# Patient Record
Sex: Male | Born: 1975 | Race: Black or African American | Hispanic: No | Marital: Single
Health system: Southern US, Community
[De-identification: ages and names within clinical notes are randomized; demographics above are authoritative.]

---

## 2004-10-16 ENCOUNTER — Other Ambulatory Visit: Payer: Self-pay

## 2004-10-16 ENCOUNTER — Emergency Department: Payer: Self-pay | Admitting: Internal Medicine

## 2005-01-03 ENCOUNTER — Emergency Department: Payer: Self-pay | Admitting: Emergency Medicine

## 2006-06-12 ENCOUNTER — Other Ambulatory Visit: Payer: Self-pay

## 2006-06-12 ENCOUNTER — Emergency Department: Payer: Self-pay | Admitting: Emergency Medicine

## 2006-08-11 ENCOUNTER — Emergency Department: Payer: Self-pay | Admitting: Emergency Medicine

## 2007-03-11 ENCOUNTER — Emergency Department: Payer: Self-pay | Admitting: Emergency Medicine

## 2007-03-12 ENCOUNTER — Emergency Department: Payer: Self-pay | Admitting: Emergency Medicine

## 2008-06-24 ENCOUNTER — Emergency Department: Payer: Self-pay | Admitting: Emergency Medicine

## 2010-01-06 ENCOUNTER — Emergency Department: Payer: Self-pay | Admitting: Emergency Medicine

## 2010-06-07 ENCOUNTER — Emergency Department: Payer: Self-pay | Admitting: Emergency Medicine

## 2012-05-13 ENCOUNTER — Emergency Department: Payer: Self-pay | Admitting: Emergency Medicine

## 2012-05-13 LAB — CBC
HGB: 14.2 g/dL (ref 13.0–18.0)
Platelet: 204 10*3/uL (ref 150–440)
RBC: 5.15 10*6/uL (ref 4.40–5.90)
RDW: 15.1 % — ABNORMAL HIGH (ref 11.5–14.5)

## 2012-05-13 LAB — BASIC METABOLIC PANEL
Anion Gap: 5 — ABNORMAL LOW (ref 7–16)
BUN: 14 mg/dL (ref 7–18)
Chloride: 110 mmol/L — ABNORMAL HIGH (ref 98–107)
Creatinine: 1 mg/dL (ref 0.60–1.30)
Glucose: 100 mg/dL — ABNORMAL HIGH (ref 65–99)
Osmolality: 284 (ref 275–301)
Sodium: 142 mmol/L (ref 136–145)

## 2012-05-13 LAB — CK TOTAL AND CKMB (NOT AT ARMC): CK-MB: 1 ng/mL (ref 0.5–3.6)

## 2012-05-16 ENCOUNTER — Emergency Department: Payer: Self-pay | Admitting: Emergency Medicine

## 2016-10-31 ENCOUNTER — Emergency Department: Payer: Self-pay

## 2016-10-31 ENCOUNTER — Encounter: Payer: Self-pay | Admitting: Emergency Medicine

## 2016-10-31 ENCOUNTER — Emergency Department
Admission: EM | Admit: 2016-10-31 | Discharge: 2016-10-31 | Disposition: A | Discharge status: Home / Self Care | Payer: Self-pay | Attending: Emergency Medicine | Admitting: Emergency Medicine

## 2016-10-31 DIAGNOSIS — S060X1A Concussion with loss of consciousness of 30 minutes or less, initial encounter: Secondary | ICD-10-CM | POA: Insufficient documentation

## 2016-10-31 DIAGNOSIS — W51XXXA Accidental striking against or bumped into by another person, initial encounter: Secondary | ICD-10-CM | POA: Insufficient documentation

## 2016-10-31 DIAGNOSIS — Y999 Unspecified external cause status: Secondary | ICD-10-CM | POA: Insufficient documentation

## 2016-10-31 DIAGNOSIS — Y9364 Activity, baseball: Secondary | ICD-10-CM | POA: Insufficient documentation

## 2016-10-31 DIAGNOSIS — S20212A Contusion of left front wall of thorax, initial encounter: Secondary | ICD-10-CM | POA: Insufficient documentation

## 2016-10-31 DIAGNOSIS — Y929 Unspecified place or not applicable: Secondary | ICD-10-CM | POA: Insufficient documentation

## 2016-10-31 LAB — CBC WITH DIFFERENTIAL/PLATELET
Basophils Absolute: 0 10*3/uL (ref 0–0.1)
Basophils Relative: 0 %
EOS ABS: 0.2 10*3/uL (ref 0–0.7)
EOS PCT: 3 %
HEMATOCRIT: 43.4 % (ref 40.0–52.0)
Hemoglobin: 14.9 g/dL (ref 13.0–18.0)
LYMPHS ABS: 2 10*3/uL (ref 1.0–3.6)
Lymphocytes Relative: 28 %
MCH: 28.2 pg (ref 26.0–34.0)
MCHC: 34.3 g/dL (ref 32.0–36.0)
MCV: 82.3 fL (ref 80.0–100.0)
MONOS PCT: 9 %
Monocytes Absolute: 0.6 10*3/uL (ref 0.2–1.0)
Neutro Abs: 4.5 10*3/uL (ref 1.4–6.5)
Neutrophils Relative %: 60 %
Platelets: 186 10*3/uL (ref 150–440)
RBC: 5.28 MIL/uL (ref 4.40–5.90)
RDW: 16 % — AB (ref 11.5–14.5)
WBC: 7.4 10*3/uL (ref 3.8–10.6)

## 2016-10-31 LAB — BASIC METABOLIC PANEL
Anion gap: 10 (ref 5–15)
BUN: 18 mg/dL (ref 6–20)
CHLORIDE: 106 mmol/L (ref 101–111)
CO2: 26 mmol/L (ref 22–32)
CREATININE: 1.38 mg/dL — AB (ref 0.61–1.24)
Calcium: 9.1 mg/dL (ref 8.9–10.3)
Glucose, Bld: 83 mg/dL (ref 65–99)
Potassium: 3.4 mmol/L — ABNORMAL LOW (ref 3.5–5.1)
SODIUM: 142 mmol/L (ref 135–145)

## 2016-10-31 LAB — TROPONIN I

## 2016-10-31 MED ORDER — OXYCODONE-ACETAMINOPHEN 5-325 MG PO TABS
ORAL_TABLET | ORAL | Status: AC
  Administered 2016-10-31: 1
  Filled 2016-10-31: qty 1
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
## 2016-10-31 NOTE — Discharge Instructions (Signed)
Return to the emergency room for any new or worrisome symptoms, follow closely with primary care doctor, if he has significant headache, significant chest pain shortness of breath or you feel worse in any way please return to emergency care. Avoid head injury or any activities that could cause a head injury until all of your headache or any other concussion symptoms he may have have subsided
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
## 2016-10-31 NOTE — ED Notes (Signed)
Patient transported to CT 

## 2019-08-11 ENCOUNTER — Other Ambulatory Visit: Payer: Self-pay

## 2019-08-11 ENCOUNTER — Emergency Department: Payer: Self-pay

## 2019-08-11 DIAGNOSIS — Y929 Unspecified place or not applicable: Secondary | ICD-10-CM | POA: Insufficient documentation

## 2019-08-11 DIAGNOSIS — Y939 Activity, unspecified: Secondary | ICD-10-CM | POA: Insufficient documentation

## 2019-08-11 DIAGNOSIS — X58XXXA Exposure to other specified factors, initial encounter: Secondary | ICD-10-CM | POA: Insufficient documentation

## 2019-08-11 DIAGNOSIS — M79641 Pain in right hand: Secondary | ICD-10-CM | POA: Insufficient documentation

## 2019-08-11 DIAGNOSIS — Y999 Unspecified external cause status: Secondary | ICD-10-CM | POA: Insufficient documentation

## 2019-08-11 DIAGNOSIS — S60221A Contusion of right hand, initial encounter: Secondary | ICD-10-CM | POA: Insufficient documentation
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
## 2019-08-12 ENCOUNTER — Emergency Department
Admission: EM | Admit: 2019-08-12 | Discharge: 2019-08-12 | Disposition: A | Discharge status: Home / Self Care | Payer: Self-pay | Attending: Emergency Medicine | Admitting: Emergency Medicine

## 2019-08-12 DIAGNOSIS — S60221A Contusion of right hand, initial encounter: Secondary | ICD-10-CM

## 2019-08-12 DIAGNOSIS — M79641 Pain in right hand: Secondary | ICD-10-CM

## 2019-08-12 MED ORDER — IBUPROFEN 800 MG PO TABS: 800.0000 mg | ORAL_TABLET | Freq: Three times a day (TID) | ORAL | 0 refills | Status: DC | PRN

## 2019-08-12 MED ORDER — KETOROLAC TROMETHAMINE 60 MG/2ML IM SOLN
30.0000 mg | Freq: Once | INTRAMUSCULAR | Status: AC
  Administered 2019-08-12: 02:00:00 30 mg via INTRAMUSCULAR
  Filled 2019-08-12: qty 2

## 2019-08-12 MED ORDER — HYDROCODONE-ACETAMINOPHEN 5-325 MG PO TABS: 1.0000 | ORAL_TABLET | Freq: Four times a day (QID) | ORAL | 0 refills | Status: DC | PRN
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
## 2019-08-12 NOTE — Discharge Instructions (Addendum)
1.  Wear splint as needed for comfort. 2.  You may take pain pills as needed (Motrin/Norco #15). 3.  Return to the ER for worsening symptoms, persistent vomiting, difficulty breathing or other concerns.

## 2021-10-30 IMAGING — CR DG HAND COMPLETE 3+V*R*
3 series · 3 of 3 positions shown · non-contrast
Comparison: None.

CLINICAL DATA: Right hand pain after injury.  Swelling.

EXAM:
RIGHT HAND - COMPLETE 3+ VIEW

[hand ap]
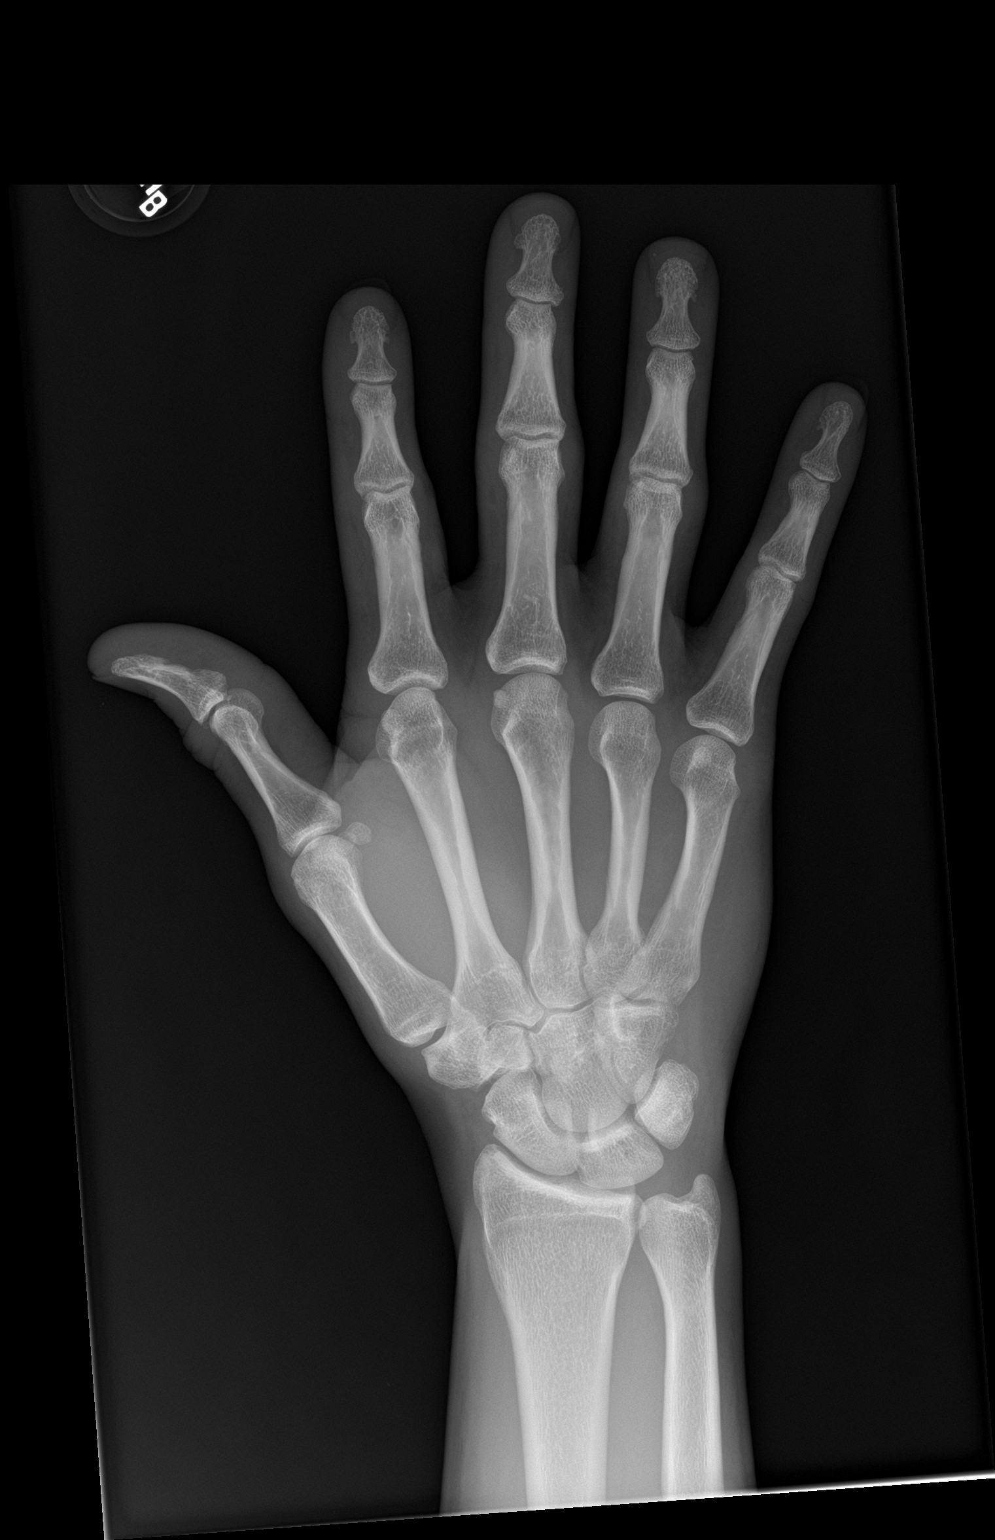

[hand obl]
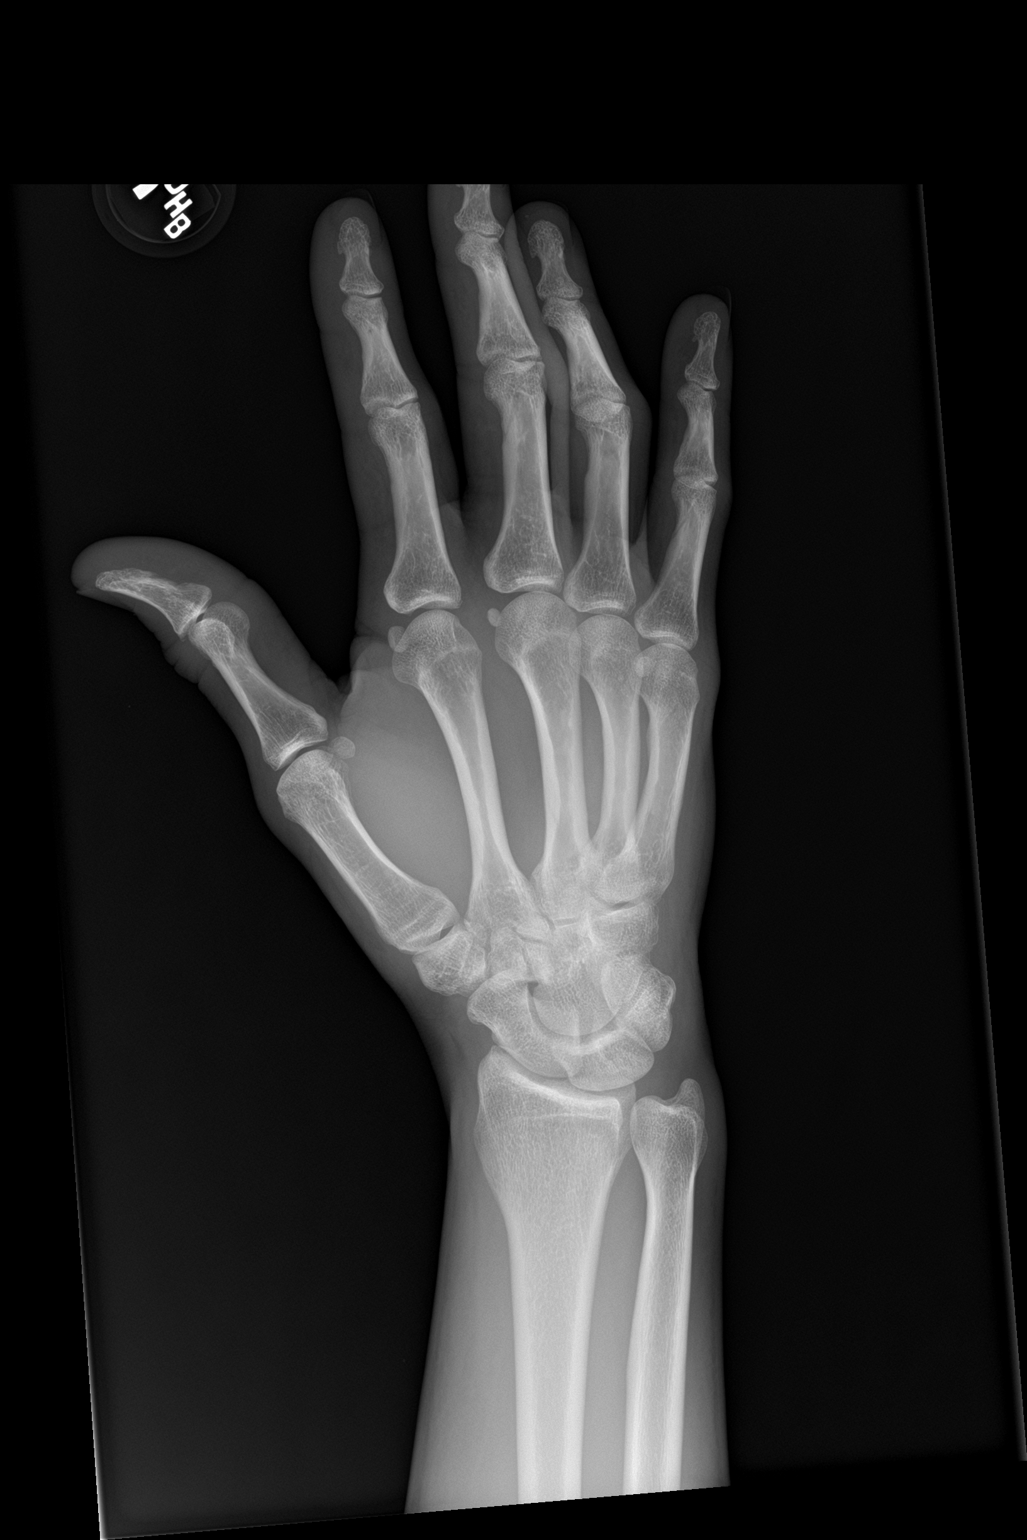

[hand lat]
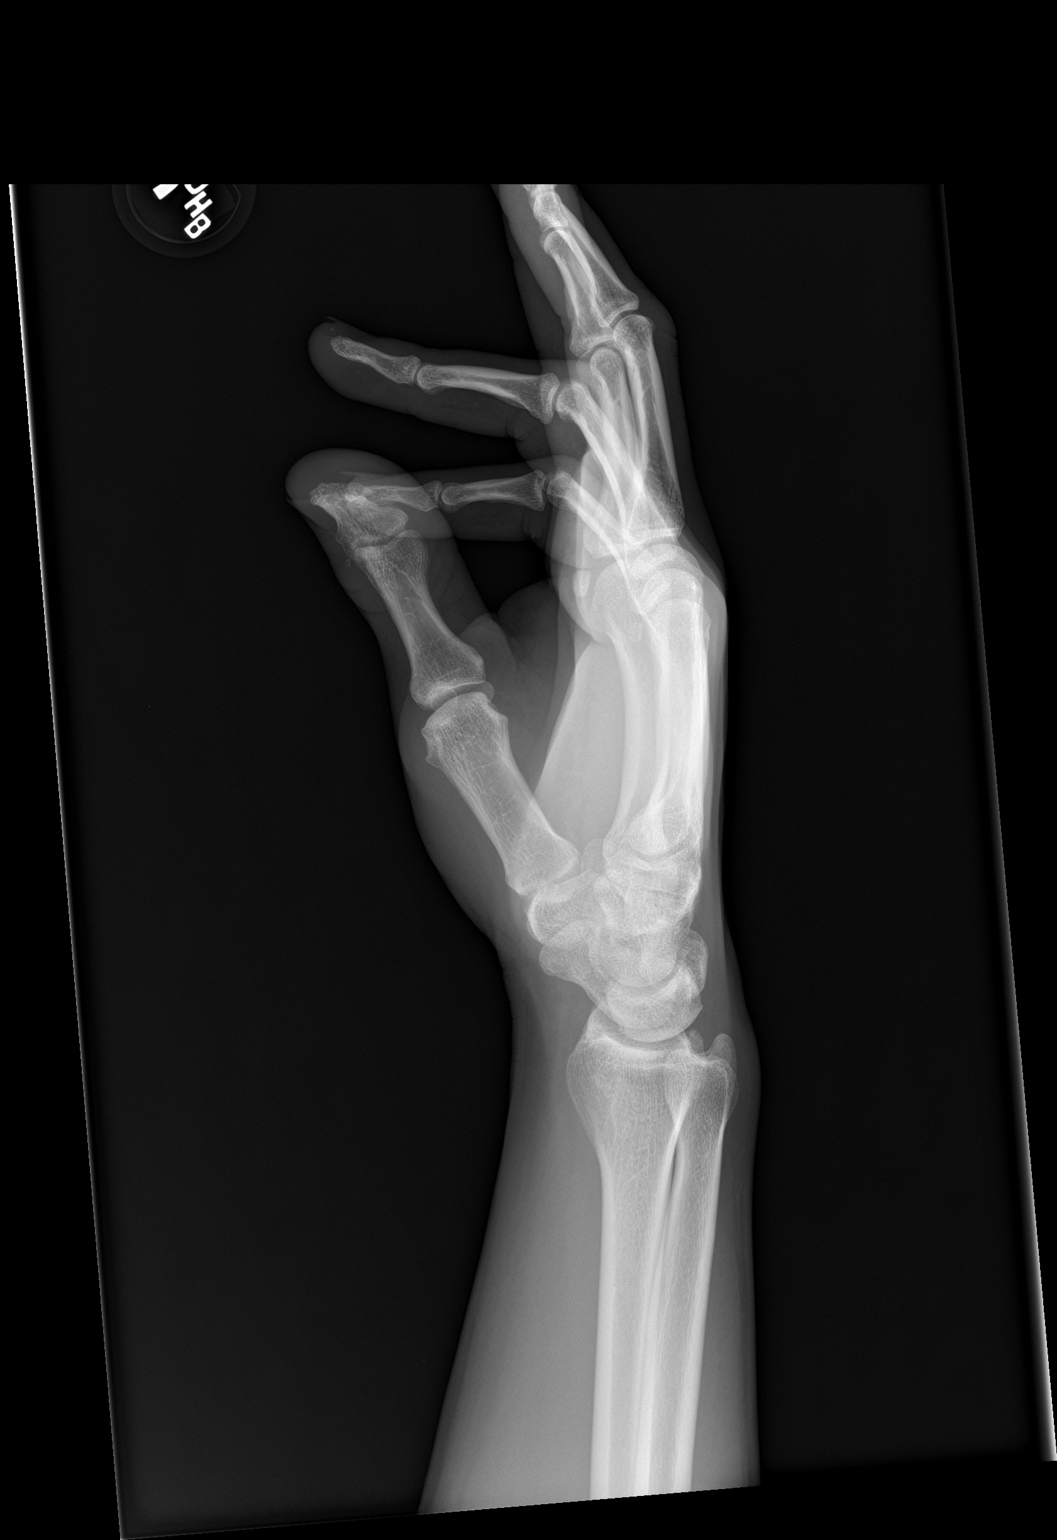

[3 of 3 positions shown; findings below may reference images not displayed]

FINDINGS: There is no evidence of fracture or dislocation. There is no
evidence of arthropathy or other focal bone abnormality. Soft
tissues are unremarkable.
IMPRESSION: Negative.

## 2023-05-09 ENCOUNTER — Encounter: Payer: Self-pay | Admitting: *Deleted

## 2023-05-27 ENCOUNTER — Telehealth: Payer: Self-pay | Admitting: *Deleted

## 2023-05-27 ENCOUNTER — Other Ambulatory Visit: Payer: Self-pay | Admitting: *Deleted

## 2023-05-27 DIAGNOSIS — Z1211 Encounter for screening for malignant neoplasm of colon: Secondary | ICD-10-CM

## 2023-05-27 MED ORDER — NA SULFATE-K SULFATE-MG SULF 17.5-3.13-1.6 GM/177ML PO SOLN: 1.0000 | Freq: Once | ORAL | 0 refills | Status: AC

## 2023-05-27 NOTE — Telephone Encounter (Signed)
 Gastroenterology Pre-Procedure Review  Request Date: 06/20/2023 Requesting Physician: Dr. Ole Berkeley  PATIENT REVIEW QUESTIONS: The patient responded to the following health history questions as indicated:    1. Are you having any GI issues? no 2. Do you have a personal history of Polyps? no 3. Do you have a family history of Colon Cancer or Polyps? no 4. Diabetes Mellitus? no 5. Joint replacements in the past 12 months?no 6. Major health problems in the past 3 months?no 7. Any artificial heart valves, MVP, or defibrillator?no    MEDICATIONS & ALLERGIES:    Patient reports the following regarding taking any anticoagulation/antiplatelet therapy:   Plavix, Coumadin, Eliquis, Xarelto, Lovenox, Pradaxa, Brilinta, or Effient? no Aspirin? no  Patient confirms/reports the following medications:  Current Outpatient Medications  Medication Sig Dispense Refill   Na Sulfate-K Sulfate-Mg Sulfate concentrate (SUPREP) 17.5-3.13-1.6 GM/177ML SOLN Take 1 kit (354 mLs total) by mouth once for 1 dose. 354 mL 0   No current facility-administered medications for this visit.    Patient confirms/reports the following allergies:  No Known Allergies  No orders of the defined types were placed in this encounter.   AUTHORIZATION INFORMATION Primary Insurance: 1D#: Group #:  Secondary Insurance: 1D#: Group #:  SCHEDULE INFORMATION: Date: 06/20/2023 Time: Location:  [facility]

## 2023-06-20 ENCOUNTER — Encounter
Admission: RE | Disposition: A | Discharge status: Home / Self Care | Payer: Self-pay | Source: Home / Self Care | Attending: Gastroenterology

## 2023-06-20 ENCOUNTER — Ambulatory Visit: Admitting: Certified Registered"

## 2023-06-20 ENCOUNTER — Encounter: Payer: Self-pay | Admitting: Gastroenterology

## 2023-06-20 ENCOUNTER — Ambulatory Visit
Admission: RE | Admit: 2023-06-20 | Discharge: 2023-06-20 | Disposition: A | Discharge status: Home / Self Care | Attending: Gastroenterology | Admitting: Gastroenterology

## 2023-06-20 ENCOUNTER — Other Ambulatory Visit: Payer: Self-pay

## 2023-06-20 DIAGNOSIS — K640 First degree hemorrhoids: Secondary | ICD-10-CM | POA: Insufficient documentation

## 2023-06-20 DIAGNOSIS — Z1211 Encounter for screening for malignant neoplasm of colon: Secondary | ICD-10-CM

## 2023-06-20 HISTORY — PX: COLONOSCOPY: SHX5424

## 2023-06-20 SURGERY — COLONOSCOPY
Anesthesia: General

## 2023-06-20 MED ORDER — LIDOCAINE HCL (CARDIAC) PF 100 MG/5ML IV SOSY
PREFILLED_SYRINGE | INTRAVENOUS | Status: DC | PRN
  Administered 2023-06-20: 50 mg via INTRAVENOUS

## 2023-06-20 MED ORDER — PROPOFOL 10 MG/ML IV BOLUS
INTRAVENOUS | Status: DC | PRN
  Administered 2023-06-20: 150 mg via INTRAVENOUS
  Administered 2023-06-20: 40 mg via INTRAVENOUS
  Administered 2023-06-20: 60 mg via INTRAVENOUS
  Administered 2023-06-20 (×2): 40 mg via INTRAVENOUS

## 2023-06-20 MED ORDER — SODIUM CHLORIDE 0.9 % IV SOLN: INTRAVENOUS | Status: DC

## 2023-06-20 NOTE — Anesthesia Postprocedure Evaluation (Signed)
 Anesthesia Post Note  Patient: Dean Hamilton.  Procedure(s) Performed: COLONOSCOPY  Patient location during evaluation: Endoscopy Anesthesia Type: General Level of consciousness: awake and alert Pain management: pain level controlled Vital Signs Assessment: post-procedure vital signs reviewed and stable Respiratory status: spontaneous breathing, nonlabored ventilation, respiratory function stable and patient connected to nasal cannula oxygen Cardiovascular status: blood pressure returned to baseline and stable Postop Assessment: no apparent nausea or vomiting Anesthetic complications: no  No notable events documented.   Last Vitals:  Vitals:   06/20/23 1035 06/20/23 1045  BP: 122/64 117/83  Pulse:  (!) 56  Resp: 13 16  Temp:    SpO2:  100%    Last Pain:  Vitals:   06/20/23 1045  TempSrc:   PainSc: 0-No pain                 Enrique Harvest
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
## 2023-06-20 NOTE — Op Note (Signed)
 [facility] Gastroenterology Patient Name: Dean Hamilton Procedure Date: 06/20/2023 9:53 AM MRN: [id number] Account #: [id number] Date of Birth: 02/22/1975 Admit Type: Outpatient Age: 48 Room: [facility] ENDO ROOM 4 Gender: Male Note Status: Finalized Instrument Name: Charlyn Cooley [id number] Procedure:             Colonoscopy Indications:           Screening for colorectal malignant neoplasm Providers:             Marnee Sink MD, MD Referring MD:          Marnee Sink MD, MD (Referring MD), No Local Md, MD                         (Referring MD) Medicines:             Propofol per Anesthesia Complications:         No immediate complications. Procedure:             Pre-Anesthesia Assessment:                        - Prior to the procedure, a History and Physical was                         performed, and patient medications and allergies were                         reviewed. The patient's tolerance of previous                         anesthesia was also reviewed. The risks and benefits                         of the procedure and the sedation options and risks                         were discussed with the patient. All questions were                         answered, and informed consent was obtained. Prior                         Anticoagulants: The patient has taken no anticoagulant                         or antiplatelet agents. ASA Grade Assessment: II - A                         patient with mild systemic disease. After reviewing                         the risks and benefits, the patient was deemed in                         satisfactory condition to undergo the procedure.                        After obtaining informed consent, the colonoscope was  passed under direct vision. Throughout the procedure,                         the patient's blood pressure, pulse, and oxygen                         saturations were monitored continuously. The                          Colonoscope was introduced through the anus and                         advanced to the the cecum, identified by appendiceal                         orifice and ileocecal valve. The colonoscopy was                         performed without difficulty. The patient tolerated                         the procedure well. The quality of the bowel                         preparation was excellent. Findings:      The perianal and digital rectal examinations were normal.      Non-bleeding internal hemorrhoids were found during retroflexion. The       hemorrhoids were Grade I (internal hemorrhoids that do not prolapse).      The exam was otherwise without abnormality. Impression:            - Non-bleeding internal hemorrhoids.                        - The examination was otherwise normal.                        - No specimens collected. Recommendation:        - Discharge patient to home.                        - Resume previous diet.                        - Continue present medications.                        - Repeat colonoscopy in 10 years for screening                         purposes. Procedure Code(s):     --- Professional ---                        [phone], Colonoscopy, flexible; diagnostic, including                         collection of specimen(s) by brushing or washing, when                         performed (separate procedure) Diagnosis Code(s):     ---  Professional ---                        Z12.11, Encounter for screening for malignant neoplasm                         of colon CPT copyright 2022 [employer] Association. All rights reserved. The codes documented in this report are preliminary and upon coder review may  be revised to meet current compliance requirements. Marnee Sink MD, MD 06/20/2023 10:23:48 AM This report has been signed electronically. Number of Addenda: 0 Note Initiated On: 06/20/2023 9:53 AM Scope Withdrawal Time: 0 hours 6 minutes 33  seconds  Total Procedure Duration: 0 hours 9 minutes 54 seconds  Estimated Blood Loss:  Estimated blood loss: none.      [facility]

## 2023-06-20 NOTE — Transfer of Care (Signed)
 Immediate Anesthesia Transfer of Care Note  Patient: Dean Hamilton.  Procedure(s) Performed: COLONOSCOPY  Patient Location: Endoscopy Unit  Anesthesia Type:General  Level of Consciousness: drowsy  Airway & Oxygen Therapy: Patient Spontanous Breathing  Post-op Assessment: Report given to RN, Post -op Vital signs reviewed and stable, and Patient moving all extremities  Post vital signs: Reviewed and stable  Last Vitals:  Vitals Value Taken Time  BP 105/52 06/20/23 1026  Temp 35.6 C 06/20/23 1025  Pulse 63 06/20/23 1027  Resp 22 06/20/23 1028  SpO2 100 % 06/20/23 1027  Vitals shown include unfiled device data.  Last Pain:  Vitals:   06/20/23 1025  TempSrc: Temporal  PainSc: 0-No pain         Complications: No notable events documented.

## 2023-06-20 NOTE — H&P (Signed)
   Marnee Sink, MD [facility] [address], [address] [place], [place] [address] Phone: [phone] Fax : [phone]  Primary Care Physician:  [facility] Primary Gastroenterologist:  Dr. Ole Berkeley  Pre-Procedure History & Physical: HPI:  Dean Hamilton. is a 48 y.o. male is here for a screening colonoscopy.   History reviewed. No pertinent past medical history.  History reviewed. No pertinent surgical history.  Prior to Admission medications   Not on File    Allergies as of 05/27/2023   (No Known Allergies)    History reviewed. No pertinent family history.  Social History   Socioeconomic History   Marital status: Single    Spouse name: Not on file   Number of children: Not on file   Years of education: Not on file   Highest education level: Not on file  Occupational History   Not on file  Tobacco Use   Smoking status: Not on file   Smokeless tobacco: Not on file  Substance and Sexual Activity   Alcohol use: Not on file   Drug use: Not on file   Sexual activity: Not on file  Other Topics Concern   Not on file  Social History Narrative   Not on file   Social Drivers of Health   Financial Resource Strain: Not on file  Food Insecurity: Not on file  Transportation Needs: Not on file  Physical Activity: Not on file  Stress: Not on file  Social Connections: Not on file  Intimate Partner Violence: Not on file    Review of Systems: See HPI, otherwise negative ROS  Physical Exam: BP 128/86   Pulse (!) 51   Temp 97.8 F (36.6 C) (Oral)   Resp 18   Ht 5\' 10"  (1.778 m)   Wt 80.7 kg   SpO2 100%   BMI 25.54 kg/m  General:   Alert,  pleasant and cooperative in NAD Head:  Normocephalic and atraumatic. Neck:  Supple; no masses or thyromegaly. Lungs:  Clear throughout to auscultation.    Heart:  Regular rate and rhythm. Abdomen:  Soft, nontender and nondistended. Normal bowel sounds, without guarding, and without rebound.   Neurologic:  Alert and   oriented x4;  grossly normal neurologically.  Impression/Plan: Dean Hamilton. is now here to undergo a screening colonoscopy.  Risks, benefits, and alternatives regarding colonoscopy have been reviewed with the patient.  Questions have been answered.  All parties agreeable.

## 2023-06-21 ENCOUNTER — Encounter: Payer: Self-pay | Admitting: Gastroenterology

## 2023-10-31 ENCOUNTER — Other Ambulatory Visit: Payer: Self-pay

## 2023-10-31 ENCOUNTER — Emergency Department
Admission: EM | Admit: 2023-10-31 | Discharge: 2023-10-31 | Disposition: A | Discharge status: Home / Self Care | Attending: Emergency Medicine | Admitting: Emergency Medicine

## 2023-10-31 ENCOUNTER — Emergency Department

## 2023-10-31 DIAGNOSIS — S5001XA Contusion of right elbow, initial encounter: Secondary | ICD-10-CM | POA: Insufficient documentation

## 2023-10-31 DIAGNOSIS — Y9364 Activity, baseball: Secondary | ICD-10-CM | POA: Diagnosis not present

## 2023-10-31 DIAGNOSIS — W228XXA Striking against or struck by other objects, initial encounter: Secondary | ICD-10-CM | POA: Insufficient documentation

## 2023-10-31 MED ORDER — NAPROXEN 500 MG PO TABS
500.0000 mg | ORAL_TABLET | Freq: Once | ORAL | Status: AC
  Administered 2023-10-31: 500 mg via ORAL
  Filled 2023-10-31: qty 1

## 2023-10-31 MED ORDER — NAPROXEN 500 MG PO TABS: 500.0000 mg | ORAL_TABLET | Freq: Two times a day (BID) | ORAL | 0 refills | Status: AC

## 2023-10-31 MED ORDER — CYCLOBENZAPRINE HCL 5 MG PO TABS: 5.0000 mg | ORAL_TABLET | Freq: Three times a day (TID) | ORAL | 0 refills | Status: AC | PRN

## 2023-10-31 NOTE — Discharge Instructions (Addendum)
 Your exam and x-ray are normal at this time.  No signs of a elbow fracture or dislocation.  Symptoms are consistent with a contusion.  Apply ice to help reduce swelling.  Take OTC ibuprofen  as needed.  Follow-up with your primary provider or [facility] as needed.

## 2023-10-31 NOTE — ED Provider Notes (Signed)
 [facility] Emergency Department Provider Note     Event Date/Time   First MD Initiated Contact with Patient 10/31/23 2139     (approximate)   History   Arm Injury   HPI  Dean Hamilton. is a 48 y.o. right-handed male presents to the ED endorsing right elbow pain.  Patient reports contusion to his medial right elbow, while playing softball with his league.  He is unclear whether he hit the basements head while coming in to a base run.  He denies any head injury or LOC.  He presents with pain and swelling to the medial epicondyle of the RUE.   Physical Exam   Triage Vital Signs: ED Triage Vitals  Encounter Vitals Group     BP 10/31/23 2105 (!) 151/98     Girls Systolic BP Percentile --      Girls Diastolic BP Percentile --      Boys Systolic BP Percentile --      Boys Diastolic BP Percentile --      Pulse Rate 10/31/23 2105 80     Resp 10/31/23 2105 18     Temp 10/31/23 2105 98.3 F (36.8 C)     Temp Source 10/31/23 2105 Oral     SpO2 10/31/23 2105 97 %     Weight 10/31/23 2106 185 lb (83.9 kg)     Height 10/31/23 2106 5' 10 (1.778 m)     Head Circumference --      Peak Flow --      Pain Score 10/31/23 2106 8     Pain Loc --      Pain Education --      Exclude from Growth Chart --     Most recent vital signs: Vitals:   10/31/23 2105  BP: (!) 151/98  Pulse: 80  Resp: 18  Temp: 98.3 F (36.8 C)  SpO2: 97%    General Awake, no distress. NAD HEENT NCAT. PERRL. EOMI. No rhinorrhea. Mucous membranes are moist. CV:  Good peripheral perfusion. RRR RESP:  Normal effort. CTA MSK:  Right elbow without obvious deformity, dislocation, or joint effusion.  Active range of motion noted with flexion extension range.  Normal supination and pronation range appreciated.  Tender to palpation to the medial epicondyle.  Normal composite fist distally.  Normal wrist flexion and extension range on exam.   ED Results / Procedures / Treatments    Labs (all labs ordered are listed, but only abnormal results are displayed) Labs Reviewed - No data to display   EKG   RADIOLOGY  I personally viewed and evaluated these images as part of my medical decision making, as well as reviewing the written report by the radiologist.  ED Provider Interpretation: No acute findings  DG Elbow Complete Right Result Date: 10/31/2023 CLINICAL DATA:  injury Patient C/O right elbow pain. Patient was playing softball and was running and either hit a person or a helmet, etc., and is now C/O right elbow pain. EXAM: RIGHT ELBOW - COMPLETE 3+ VIEW COMPARISON:  None Available. FINDINGS: There is no evidence of fracture, dislocation, or joint effusion. There is no evidence of arthropathy or other focal bone abnormality. Soft tissues are unremarkable. IMPRESSION: Negative. Electronically Signed   By: Morgane  Naveau M.D.   On: 10/31/2023 21:34    PROCEDURES:  Critical Care performed: No  Procedures Jones-Watson wrap to the RUE, applied by me.  MEDICATIONS ORDERED IN ED: Medications  naproxen (NAPROSYN) tablet 500 mg (500  mg Oral Given 10/31/23 2154)     IMPRESSION / MDM / ASSESSMENT AND PLAN / ED COURSE  I reviewed the triage vital signs and the nursing notes.                              Differential diagnosis includes, but is not limited to, contusion, fracture, epicondylitis, tendinitis, bursitis  Patient's presentation is most consistent with acute complicated illness / injury requiring diagnostic workup.  Patient's diagnosis is consistent with right elbow contusion.  Patient presents with acute pain to the medial epicondyles after making contact with a basement during the softball game.  He denies any deformity or dislocation but he localized the medial epicondylar pain and some distal forearm discomfort.  Normal composite fist on exam.  No acute neuromuscular deficits noted.  Patient will be discharged home with prescriptions for naproxen and  Flexeril. Patient is to follow up with Ortho as needed or otherwise directed. Patient is given ED precautions to return to the ED for any worsening or new symptoms.   FINAL CLINICAL IMPRESSION(S) / ED DIAGNOSES   Final diagnoses:  Contusion of right elbow, initial encounter     Rx / DC Orders   ED Discharge Orders          Ordered    naproxen (NAPROSYN) 500 MG tablet  2 times daily with meals        10/31/23 2149    cyclobenzaprine (FLEXERIL) 5 MG tablet  3 times daily PRN        10/31/23 2149             Note:  This document was prepared using Dragon voice recognition software and may include unintentional dictation errors.    Loyd Candida LULLA Aldona, PA-C 10/31/23 2158    Floy Roberts, MD 10/31/23 [id number]

## 2023-10-31 NOTE — ED Triage Notes (Signed)
 Patient C/O right elbow pain. Patient was playing softball and was running and either hit a person or a helmet, etc.,  and is now C/O right elbow pain.

## 2023-10-31 NOTE — ED Notes (Signed)
 PT in no acute distress prior to discharge. Discharged instructions reviewed, all questions answered and pt verbalized understanding at this time. Pt has all belongings with them at time of discharge.
